# Patient Record
Sex: Female | Born: 1985 | Hispanic: Yes | Marital: Married | Smoking: Never smoker
Health system: Southern US, Community
[De-identification: ages and names within clinical notes are randomized; demographics above are authoritative.]

---

## 2020-09-18 ENCOUNTER — Emergency Department
Admission: EM | Admit: 2020-09-18 | Discharge: 2020-09-18 | Disposition: A | Discharge status: Home / Self Care | Payer: Self-pay | Attending: Emergency Medicine | Admitting: Emergency Medicine

## 2020-09-18 ENCOUNTER — Other Ambulatory Visit: Payer: Self-pay

## 2020-09-18 ENCOUNTER — Encounter: Payer: Self-pay | Admitting: Emergency Medicine

## 2020-09-18 ENCOUNTER — Emergency Department: Payer: Self-pay

## 2020-09-18 DIAGNOSIS — J40 Bronchitis, not specified as acute or chronic: Secondary | ICD-10-CM | POA: Insufficient documentation

## 2020-09-18 MED ORDER — PREDNISONE 50 MG PO TABS: 50.0000 mg | ORAL_TABLET | Freq: Every day | ORAL | 0 refills | Status: AC

## 2020-09-18 MED ORDER — AZITHROMYCIN 250 MG PO TABS: ORAL_TABLET | ORAL | 0 refills | Status: AC

## 2020-09-18 NOTE — ED Triage Notes (Signed)
Sinus congestion, fatigue, headache x 14 days.  Productive cough x 1 week  AAOx3.  Skin warm and dry. NAD

## 2020-09-18 NOTE — ED Provider Notes (Signed)
[facility] Emergency Department Provider Note   ____________________________________________    I have reviewed the triage vital signs and the nursing notes.   HISTORY  Chief Complaint URI     HPI Leslie Nelson is a 35 y.o. female who presents with complaints of cough.  Patient reports approximately 2 weeks ago she had sore throat, sinus congestion and then developed cough which has persisted.  She reports he took 2 COVID tests which were both negative.  Denies shortness of breath.  Reports productive cough.  No sick contacts reported.  No nausea or vomiting or chest pain  History reviewed. No pertinent past medical history.  There are no problems to display for this patient.   History reviewed. No pertinent surgical history.  Prior to Admission medications   Medication Sig Start Date End Date Taking? Authorizing Provider  azithromycin (ZITHROMAX Z-PAK) 250 MG tablet Take 2 tablets (500 mg) on  Day 1,  followed by 1 tablet (250 mg) once daily on Days 2 through 5. 09/18/20 09/23/20 Yes Jene Every, MD  predniSONE (DELTASONE) 50 MG tablet Take 1 tablet (50 mg total) by mouth daily with breakfast. 09/18/20  Yes Jene Every, MD     Allergies Patient has no known allergies.  No family history on file.  Social History Social History   Tobacco Use   Smoking status: Never    Passive exposure: Never  Vaping Use   Vaping Use: Never used    Review of Systems  Constitutional: No fever/chills  ENT: As Respiratory: No shortness of breath  Gastrointestinal: No abdominal pain.  No nausea, no vomiting.   Genitourinary: Negative for dysuria. Musculoskeletal: Negative for back pain. Skin: Negative for rash. Neurological: Negative for headaches     ____________________________________________   PHYSICAL EXAM:  VITAL SIGNS: ED Triage Vitals  Enc Vitals Group     BP 09/18/20 1603 106/89     Pulse Rate 09/18/20 1605 77     Resp 09/18/20  1605 18     Temp 09/18/20 1605 97.8 F (36.6 C)     Temp Source 09/18/20 1605 Oral     SpO2 09/18/20 1605 99 %     Weight 09/18/20 1521 68.9 kg (152 lb)     Height 09/18/20 1521 1.68 m (5' 6.14")     Head Circumference --      Peak Flow --      Pain Score 09/18/20 1520 7     Pain Loc --      Pain Edu? --      Excl. in GC? --      Constitutional: Alert and oriented. No acute distress. Pleasant and interactive Eyes: Conjunctivae are normal.  Head: Atraumatic. Nose: No congestion/rhinnorhea. Mouth/Throat: Mucous membranes are moist.   Cardiovascular: Normal rate, regular rhythm.  Respiratory: Normal respiratory effort.  No retractions.  Clear to auscultation bilateral Genitourinary: deferred Musculoskeletal: No lower extremity tenderness nor edema.   Neurologic:  Normal speech and language. No gross focal neurologic deficits are appreciated.   Skin:  Skin is warm, dry and intact. No rash noted.   ____________________________________________   LABS (all labs ordered are listed, but only abnormal results are displayed)  Labs Reviewed - No data to display ____________________________________________  EKG   ____________________________________________  RADIOLOGY  Chest x-ray reviewed by me, no acute abnormality ____________________________________________   PROCEDURES  Procedure(s) performed: No  Procedures   Critical Care performed: No ____________________________________________   INITIAL IMPRESSION / ASSESSMENT AND PLAN / ED  COURSE  Pertinent labs & imaging results that were available during my care of the patient were reviewed by me and considered in my medical decision making (see chart for details).   Patient well-appearing and in no acute distress, exam and vitals are quite reassuring, suspect bronchitis   Chest x-ray is reassuring, will treat with prednisone, Z-Pak   ____________________________________________   FINAL CLINICAL IMPRESSION(S) / ED  DIAGNOSES  Final diagnoses:  Bronchitis      NEW MEDICATIONS STARTED DURING THIS VISIT:  Discharge Medication List as of 09/18/2020  4:42 PM     START taking these medications   Details  azithromycin (ZITHROMAX Z-PAK) 250 MG tablet Take 2 tablets (500 mg) on  Day 1,  followed by 1 tablet (250 mg) once daily on Days 2 through 5., Print    predniSONE (DELTASONE) 50 MG tablet Take 1 tablet (50 mg total) by mouth daily with breakfast., Starting Mon 09/18/2020, Print         Note:  This document was prepared using Dragon voice recognition software and may include unintentional dictation errors.    Jene Every, MD 09/18/20 [phone]

## 2022-09-24 IMAGING — CR DG CHEST 2V
1 series · 2 of 2 positions shown · non-contrast
Comparison: None.

CLINICAL DATA: Cough and shortness of breath for 2 weeks.

EXAM:
CHEST - 2 VIEW

[Series 1: dg chest 2 view · 0.14mm/px · 2 of 2 slices shown]
[im 1/2]
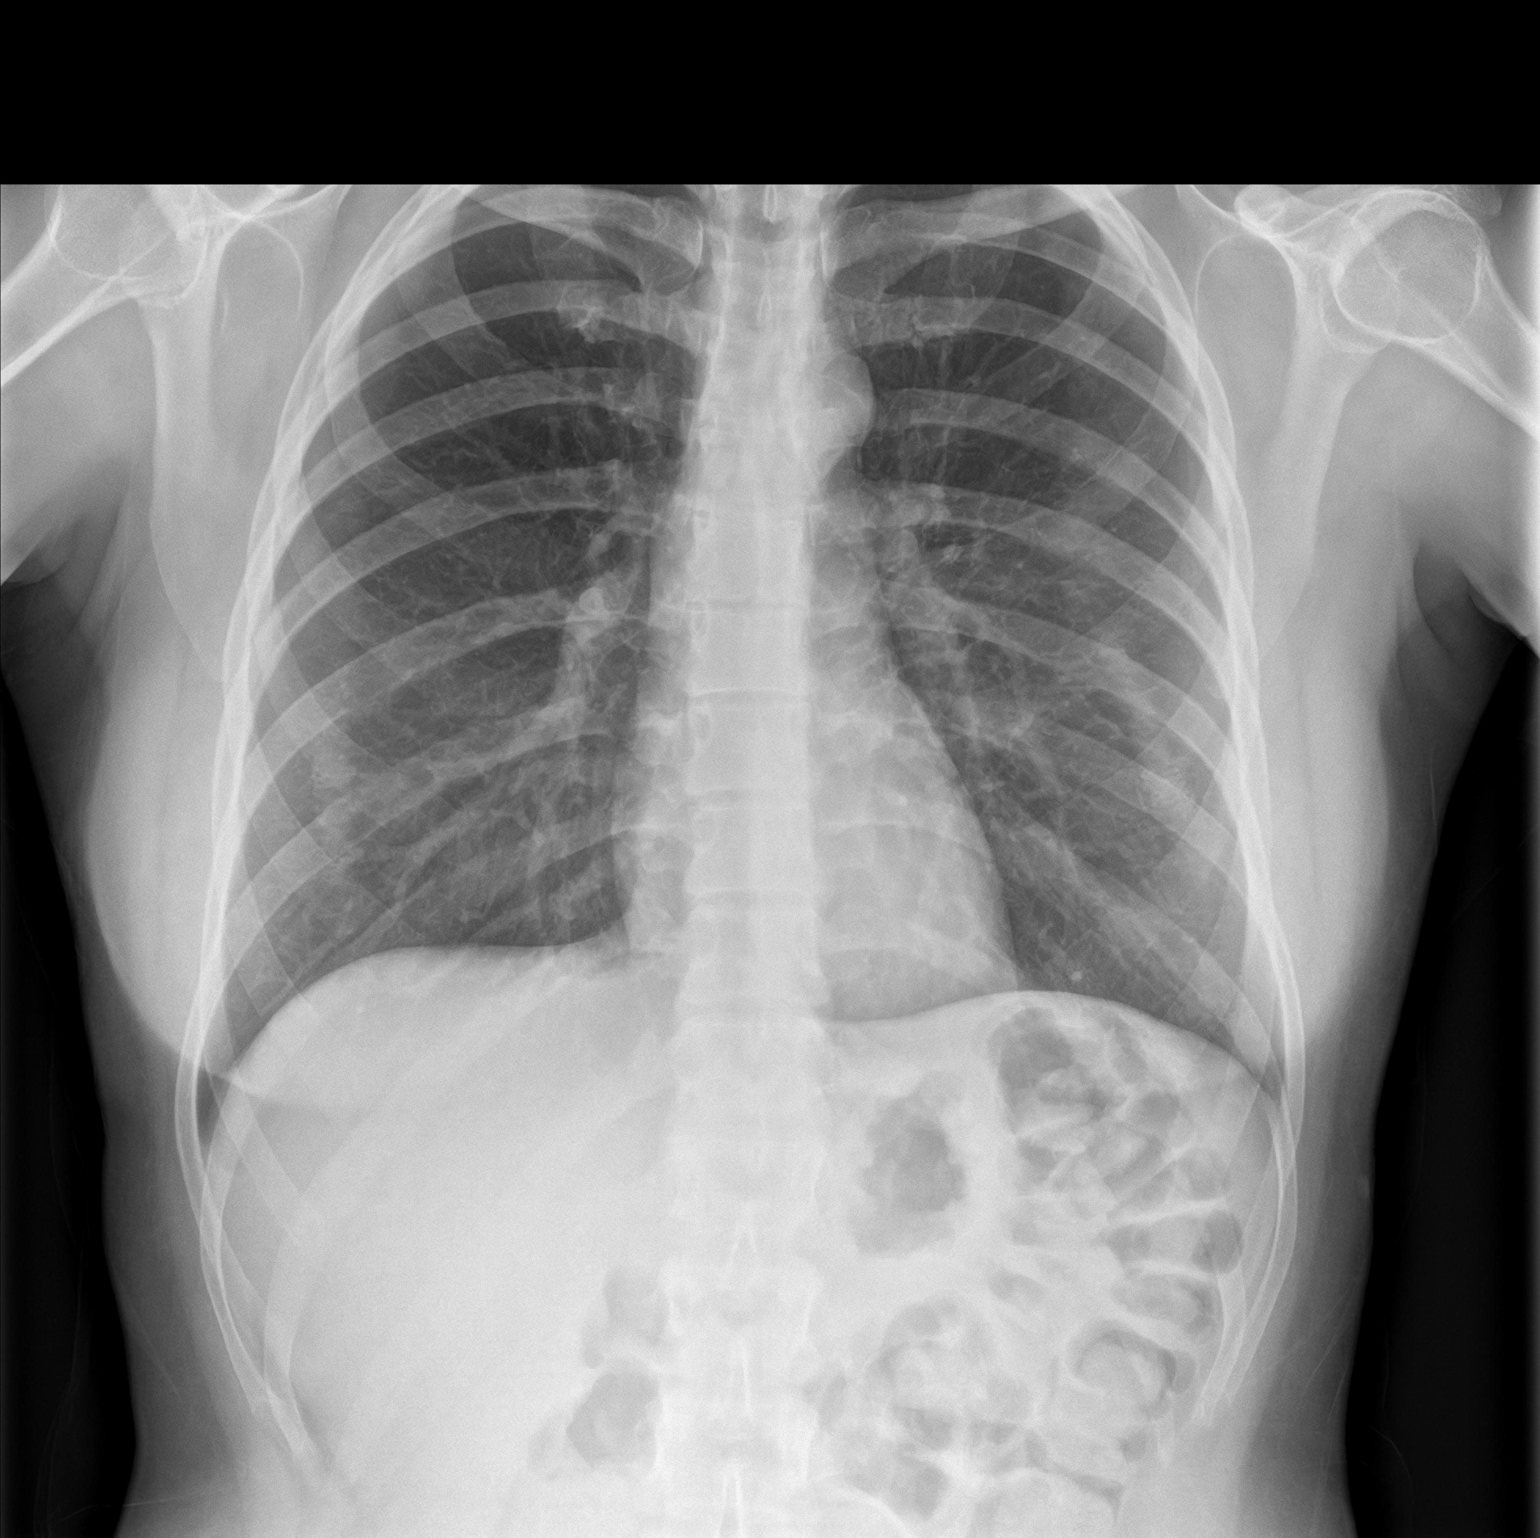
[im 2/2]
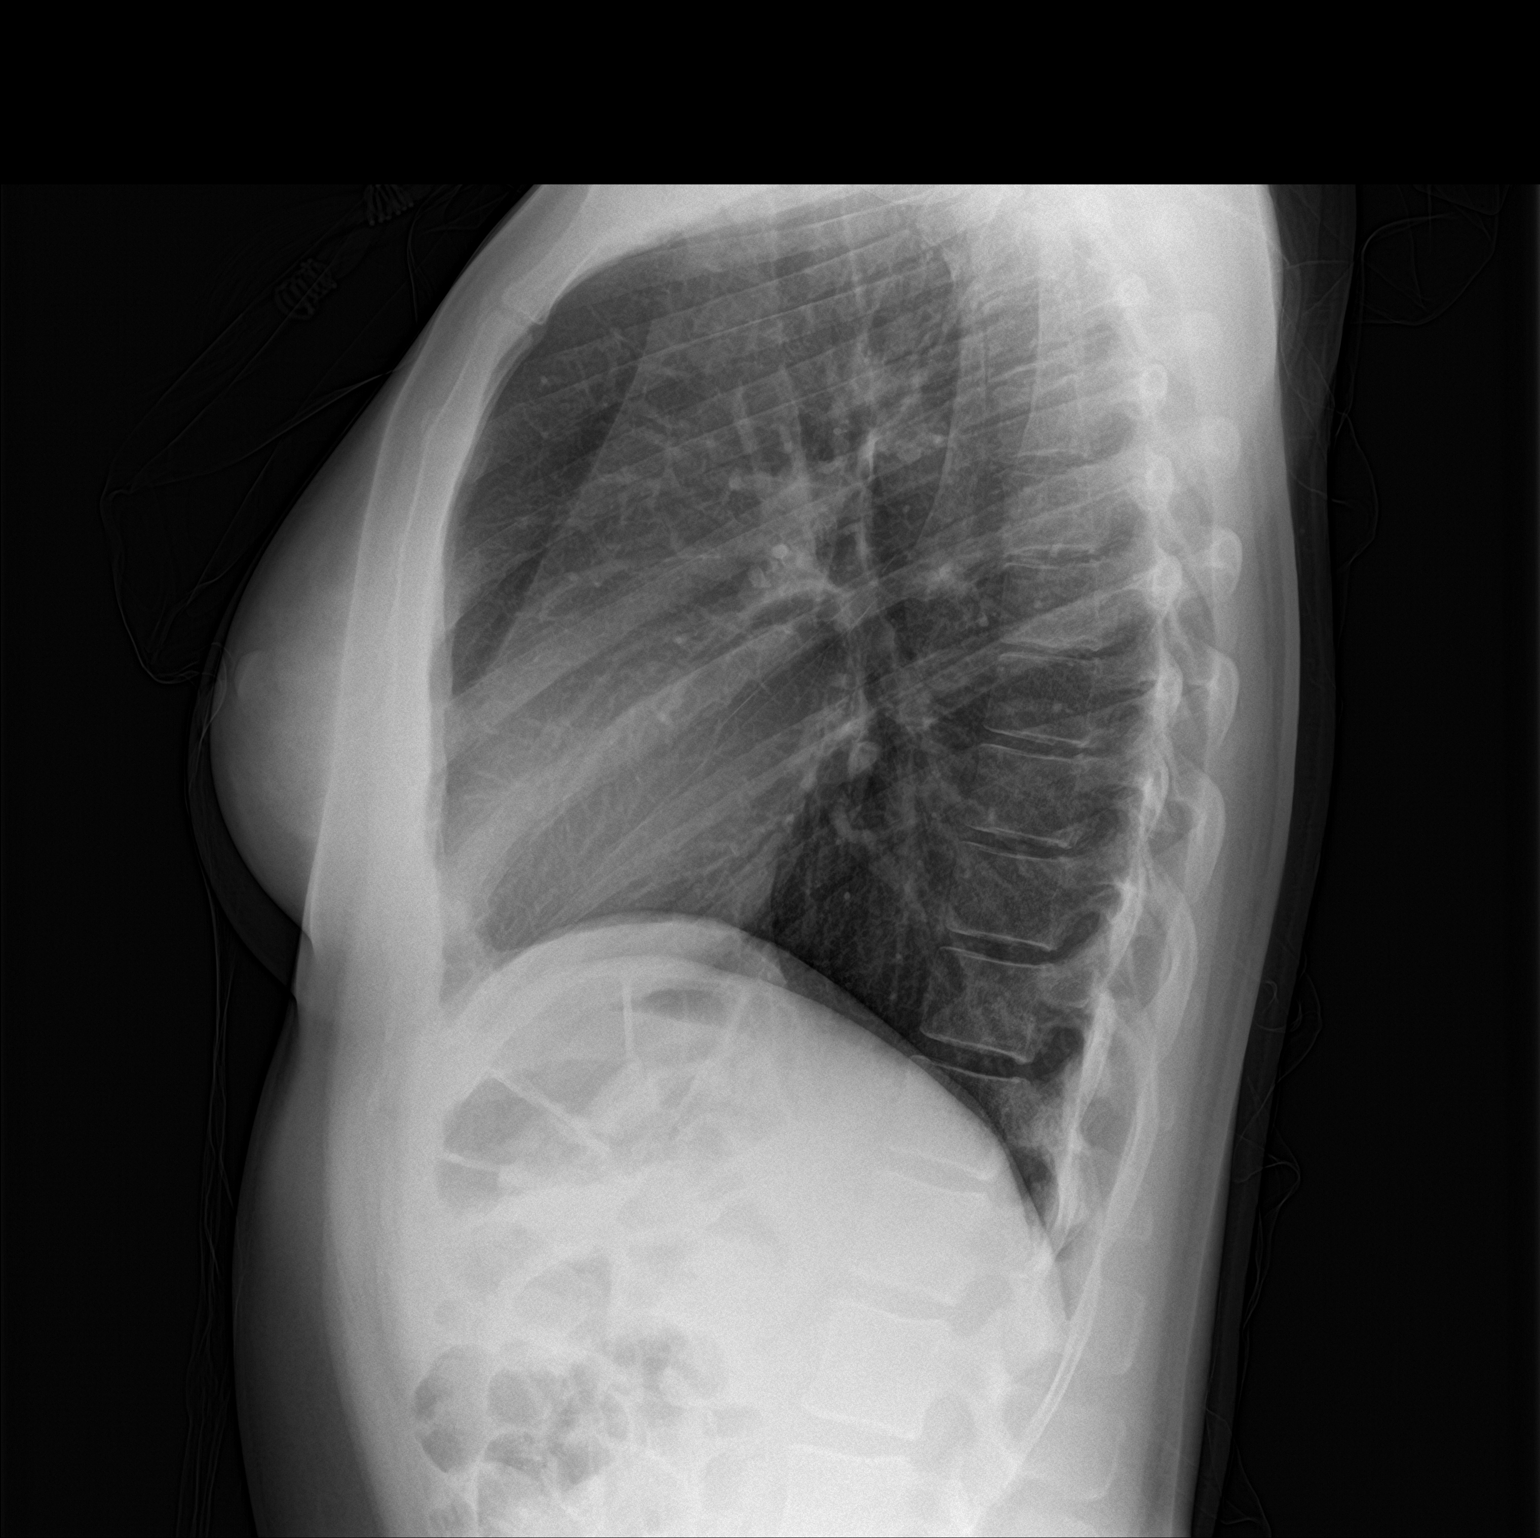

[2 of 2 positions shown; findings below may reference images not displayed]

FINDINGS: The cardiomediastinal silhouette is unremarkable.

There is no evidence of focal airspace disease, pulmonary edema,
suspicious pulmonary nodule/mass, pleural effusion, or pneumothorax.

No acute bony abnormalities are identified.
IMPRESSION: No active cardiopulmonary disease.
